# Patient Record
Sex: Female | Born: 1960 | Race: Black or African American | Hispanic: No | Marital: Married | Smoking: Never smoker
Health system: Southern US, Community
[De-identification: ages and names within clinical notes are randomized; demographics above are authoritative.]

## PROBLEM LIST (undated history)

## (undated) DIAGNOSIS — E119 Type 2 diabetes mellitus without complications: Secondary | ICD-10-CM

## (undated) DIAGNOSIS — I10 Essential (primary) hypertension: Secondary | ICD-10-CM

## (undated) DIAGNOSIS — M1990 Unspecified osteoarthritis, unspecified site: Secondary | ICD-10-CM

## (undated) HISTORY — DX: Type 2 diabetes mellitus without complications: E11.9

## (undated) HISTORY — DX: Essential (primary) hypertension: I10

## (undated) HISTORY — DX: Unspecified osteoarthritis, unspecified site: M19.90

## (undated) HISTORY — PX: ABDOMINAL HYSTERECTOMY: SHX81

---

## 2006-10-08 ENCOUNTER — Ambulatory Visit: Payer: Self-pay | Admitting: Internal Medicine

## 2006-10-16 ENCOUNTER — Ambulatory Visit: Payer: Self-pay | Admitting: Internal Medicine

## 2009-11-20 ENCOUNTER — Ambulatory Visit: Payer: Self-pay | Admitting: Internal Medicine

## 2010-11-06 ENCOUNTER — Ambulatory Visit: Payer: Self-pay | Admitting: Internal Medicine

## 2014-10-08 ENCOUNTER — Other Ambulatory Visit: Payer: Self-pay | Admitting: Physician Assistant

## 2014-10-08 DIAGNOSIS — Z1231 Encounter for screening mammogram for malignant neoplasm of breast: Secondary | ICD-10-CM

## 2014-10-12 ENCOUNTER — Ambulatory Visit
Admission: RE | Admit: 2014-10-12 | Discharge: 2014-10-12 | Disposition: A | Discharge status: Home / Self Care | Payer: BC Managed Care – PPO | Source: Ambulatory Visit | Attending: Physician Assistant | Admitting: Physician Assistant

## 2014-10-12 DIAGNOSIS — Z1231 Encounter for screening mammogram for malignant neoplasm of breast: Secondary | ICD-10-CM | POA: Diagnosis present

## 2015-10-13 ENCOUNTER — Other Ambulatory Visit: Payer: Self-pay | Admitting: Physician Assistant

## 2015-10-13 DIAGNOSIS — Z1231 Encounter for screening mammogram for malignant neoplasm of breast: Secondary | ICD-10-CM

## 2015-11-04 ENCOUNTER — Ambulatory Visit: Payer: BC Managed Care – PPO

## 2015-11-10 ENCOUNTER — Ambulatory Visit: Payer: BC Managed Care – PPO | Attending: Physician Assistant

## 2015-11-15 ENCOUNTER — Encounter: Payer: BC Managed Care – PPO | Attending: Physician Assistant | Admitting: *Deleted

## 2015-11-15 ENCOUNTER — Encounter: Payer: Self-pay | Admitting: *Deleted

## 2015-11-15 VITALS — BP 160/100 | Ht 67.0 in | Wt 237.6 lb

## 2015-11-15 DIAGNOSIS — E119 Type 2 diabetes mellitus without complications: Secondary | ICD-10-CM | POA: Insufficient documentation

## 2015-11-15 DIAGNOSIS — E1165 Type 2 diabetes mellitus with hyperglycemia: Secondary | ICD-10-CM

## 2015-11-15 NOTE — Patient Instructions (Signed)
Check blood sugars 2 x day before breakfast and 2 hrs after supper every day  Exercise: Begin walking for    15  minutes   3  days a week and gradually increase to 150 minutes/week  Eat 3 meals day,  2-3  snacks a day Space meals 4-6 hours apart Avoid sugar sweetened drinks (tea)  Bring blood sugar records to the next class  Return for classes on:

## 2015-11-15 NOTE — Progress Notes (Signed)
Diabetes Self-Management Education  Visit Type: First/Initial  Appt. Start Time: 1040 Appt. End Time: 1140  11/15/2015  [name], identified by name and date of birth, is a 55 y.o. female with a diagnosis of Diabetes: Type 2.   ASSESSMENT  Blood pressure (!) 160/100, Pt reports checking her BP at home and readings are 120-130's/70-80's height  (1.702 m), weight 237 lb 9.6 oz (107.8 kg). Body mass index is 37.21 kg/m.      Diabetes Self-Management Education - 11/15/15 1304      Visit Information   Visit Type First/Initial     Initial Visit   Diabetes Type Type 2   Are you currently following a meal plan? Yes   What type of meal plan do you follow? "making healthier choices"   Are you taking your medications as prescribed? Yes   Date Diagnosed 2002     Health Coping   How would you rate your overall health? Good     Psychosocial Assessment   Patient Belief/Attitude about Diabetes Motivated to manage diabetes  Pt also reports "stress" from daily life.    Self-care barriers None   Self-management support Doctor's office   Patient Concerns Nutrition/Meal planning;Weight Control;Medication;Glycemic Control;Monitoring;Healthy Lifestyle   Special Needs None   Preferred Learning Style Visual   Learning Readiness Change in progress   How often do you need to have someone help you when you read instructions, pamphlets, or other written materials from your doctor or pharmacy? 1 - Never   What is the last grade level you completed in school? 18 years     Pre-Education Assessment   Patient understands the diabetes disease and treatment process. Needs Instruction   Patient understands incorporating nutritional management into lifestyle. Needs Instruction   Patient undertands incorporating physical activity into lifestyle. Needs Instruction   Patient understands using medications safely. Needs Review   Patient understands monitoring blood glucose, interpreting and  using results Needs Review   Patient understands prevention, detection, and treatment of acute complications. Needs Instruction   Patient understands prevention, detection, and treatment of chronic complications. Needs Review   Patient understands how to develop strategies to address psychosocial issues. Needs Instruction   Patient understands how to develop strategies to promote health/change behavior. Needs Instruction     Complications   Last HgB A1C per patient/outside source 9.7 %  10/05/15   How often do you check your blood sugar? 3-4 times / week  Pt checks mostly fasting and occasionally in evenings.    Fasting Blood glucose range (mg/dL) >161  FBG 096 mg/dL today   Have you had a dilated eye exam in the past 12 months? Yes   Have you had a dental exam in the past 12 months? Yes   Are you checking your feet? Yes   How many days per week are you checking your feet? 7     Dietary Intake   Breakfast granola bar or [place] yogurt with fruit or boiled egg or cottage cheese with fruit or cereal   Snack (morning) apple and almonds   Lunch salads, Healthy Choice frozen meals   Snack (afternoon) apple and almonds   Dinner meat, starch and vegetables - no bread   Snack (evening) cheese and crackers   Beverage(s) water, occasional diet soda, 1/2 and 1/2 tea     Exercise   Exercise Type ADL's     Patient Education   Previous Diabetes Education No   Disease state  Definition of diabetes,  type 1 and 2, and the diagnosis of diabetes   Nutrition management  Role of diet in the treatment of diabetes and the relationship between the three main macronutrients and blood glucose level;Food label reading, portion sizes and measuring food.   Physical activity and exercise  Role of exercise on diabetes management, blood pressure control and cardiac health.   Medications Reviewed patients medication for diabetes, action, purpose, timing of dose and side effects.   Monitoring Identified appropriate  SMBG and/or A1C goals.   Acute complications Taught treatment of hypoglycemia - the 15 rule. Pt reports hypoglycemia episode in March.    Chronic complications Relationship between chronic complications and blood glucose control   Psychosocial adjustment Identified and addressed patients feelings and concerns about diabetes: Role of stress on diabetes     Individualized Goals (developed by patient)   Reducing Risk Improve blood sugars Decrease medications Prevent diabetes complications Lose weight Lead a healthier lifestyle     Outcomes   Expected Outcomes Demonstrated interest in learning. Expect positive outcomes   Future DMSE 4-6 wks      Individualized Plan for Diabetes Self-Management Training:   Learning Objective:  Patient will have a greater understanding of diabetes self-management. Patient education plan is to attend individual and/or group sessions per assessed needs and concerns.   Plan:   Patient Instructions  Check blood sugars 2 x day before breakfast and 2 hrs after supper every day Exercise: Begin walking for    15  minutes   3  days a week and gradually increase to 150 minutes/week Eat 3 meals day,  2-3  snacks a day Space meals 4-6 hours apart Avoid sugar sweetened drinks (tea) Bring blood sugar records to the next class   Expected Outcomes:  Demonstrated interest in learning. Expect positive outcomes  Education material provided:  General Meal Planning Guidelines Simple Meal Plan Glucose tablets Symptoms, causes and treatments of Hypoglycemia  If problems or questions, patient to contact team via:  Sharion Settler, RN, CCM, CDE [phone]  Future DSME appointment: 4-6 wks  December 13, 2015 for Diabetes Class 1

## 2015-12-13 ENCOUNTER — Ambulatory Visit: Payer: BC Managed Care – PPO

## 2015-12-15 ENCOUNTER — Telehealth: Payer: Self-pay | Admitting: Dietician

## 2015-12-15 NOTE — Telephone Encounter (Signed)
Called patient regarding class which she missed on 12/13/15. Unable to leave a message.

## 2015-12-22 ENCOUNTER — Ambulatory Visit
Admission: RE | Admit: 2015-12-22 | Discharge: 2015-12-22 | Disposition: A | Discharge status: Home / Self Care | Payer: BC Managed Care – PPO | Source: Ambulatory Visit | Attending: Physician Assistant | Admitting: Physician Assistant

## 2015-12-22 ENCOUNTER — Other Ambulatory Visit: Payer: Self-pay | Admitting: Physician Assistant

## 2015-12-22 DIAGNOSIS — Z1231 Encounter for screening mammogram for malignant neoplasm of breast: Secondary | ICD-10-CM

## 2015-12-31 ENCOUNTER — Ambulatory Visit
Admission: RE | Admit: 2015-12-31 | Payer: BC Managed Care – PPO | Source: Ambulatory Visit | Admitting: Unknown Physician Specialty

## 2015-12-31 ENCOUNTER — Encounter: Admission: RE | Payer: Self-pay | Source: Ambulatory Visit

## 2015-12-31 SURGERY — COLONOSCOPY WITH PROPOFOL
Anesthesia: General

## 2016-01-10 ENCOUNTER — Ambulatory Visit: Payer: BC Managed Care – PPO

## 2016-01-13 ENCOUNTER — Telehealth: Payer: Self-pay | Admitting: Dietician

## 2016-01-13 NOTE — Telephone Encounter (Signed)
Called patient after she missed class 1 on 01/10/16. Unable to leave message, as her mailbox is full.

## 2016-01-17 ENCOUNTER — Ambulatory Visit: Payer: BC Managed Care – PPO

## 2016-01-20 ENCOUNTER — Encounter: Payer: Self-pay | Admitting: *Deleted

## 2016-01-24 ENCOUNTER — Ambulatory Visit: Payer: BC Managed Care – PPO

## 2016-05-05 ENCOUNTER — Ambulatory Visit
Admission: RE | Admit: 2016-05-05 | Payer: BC Managed Care – PPO | Source: Ambulatory Visit | Admitting: Unknown Physician Specialty

## 2016-05-05 ENCOUNTER — Encounter: Payer: Self-pay | Admitting: Anesthesiology

## 2016-05-05 SURGERY — COLONOSCOPY WITH PROPOFOL
Anesthesia: General

## 2016-11-09 ENCOUNTER — Other Ambulatory Visit: Payer: Self-pay | Admitting: Physician Assistant

## 2016-11-09 DIAGNOSIS — Z1231 Encounter for screening mammogram for malignant neoplasm of breast: Secondary | ICD-10-CM

## 2017-01-26 ENCOUNTER — Encounter
Admission: RE | Disposition: A | Discharge status: Home / Self Care | Payer: Self-pay | Source: Ambulatory Visit | Attending: Unknown Physician Specialty

## 2017-01-26 ENCOUNTER — Ambulatory Visit: Payer: BC Managed Care – PPO | Admitting: Anesthesiology

## 2017-01-26 ENCOUNTER — Ambulatory Visit
Admission: RE | Admit: 2017-01-26 | Discharge: 2017-01-26 | Disposition: A | Discharge status: Home / Self Care | Payer: BC Managed Care – PPO | Source: Ambulatory Visit | Attending: Unknown Physician Specialty | Admitting: Unknown Physician Specialty

## 2017-01-26 ENCOUNTER — Encounter: Payer: Self-pay | Admitting: *Deleted

## 2017-01-26 DIAGNOSIS — Z79899 Other long term (current) drug therapy: Secondary | ICD-10-CM | POA: Insufficient documentation

## 2017-01-26 DIAGNOSIS — Z803 Family history of malignant neoplasm of breast: Secondary | ICD-10-CM | POA: Diagnosis not present

## 2017-01-26 DIAGNOSIS — Z880 Allergy status to penicillin: Secondary | ICD-10-CM | POA: Insufficient documentation

## 2017-01-26 DIAGNOSIS — K640 First degree hemorrhoids: Secondary | ICD-10-CM | POA: Insufficient documentation

## 2017-01-26 DIAGNOSIS — Z7984 Long term (current) use of oral hypoglycemic drugs: Secondary | ICD-10-CM | POA: Insufficient documentation

## 2017-01-26 DIAGNOSIS — K635 Polyp of colon: Secondary | ICD-10-CM | POA: Insufficient documentation

## 2017-01-26 DIAGNOSIS — Z1211 Encounter for screening for malignant neoplasm of colon: Secondary | ICD-10-CM | POA: Diagnosis not present

## 2017-01-26 DIAGNOSIS — Z833 Family history of diabetes mellitus: Secondary | ICD-10-CM | POA: Diagnosis not present

## 2017-01-26 DIAGNOSIS — K5730 Diverticulosis of large intestine without perforation or abscess without bleeding: Secondary | ICD-10-CM | POA: Insufficient documentation

## 2017-01-26 DIAGNOSIS — M1990 Unspecified osteoarthritis, unspecified site: Secondary | ICD-10-CM | POA: Insufficient documentation

## 2017-01-26 DIAGNOSIS — I10 Essential (primary) hypertension: Secondary | ICD-10-CM | POA: Insufficient documentation

## 2017-01-26 DIAGNOSIS — E119 Type 2 diabetes mellitus without complications: Secondary | ICD-10-CM | POA: Insufficient documentation

## 2017-01-26 HISTORY — PX: COLONOSCOPY WITH PROPOFOL: SHX5780

## 2017-01-26 LAB — GLUCOSE, CAPILLARY
GLUCOSE-CAPILLARY: 327 mg/dL — AB (ref 65–99)
GLUCOSE-CAPILLARY: 261 mg/dL — AB (ref 65–99)

## 2017-01-26 SURGERY — COLONOSCOPY WITH PROPOFOL
Anesthesia: General

## 2017-01-26 MED ORDER — SODIUM CHLORIDE 0.9 % IV SOLN: INTRAVENOUS | Status: DC

## 2017-01-26 MED ORDER — PROPOFOL 10 MG/ML IV BOLUS
INTRAVENOUS | Status: DC | PRN
  Administered 2017-01-26: 50 mg via INTRAVENOUS
  Administered 2017-01-26: 100 mg via INTRAVENOUS

## 2017-01-26 MED ORDER — SODIUM CHLORIDE 0.9 % IV SOLN
INTRAVENOUS | Status: DC
  Administered 2017-01-26: 09:00:00 via INTRAVENOUS

## 2017-01-26 MED ORDER — LIDOCAINE HCL (PF) 2 % IJ SOLN
INTRAMUSCULAR | Status: AC
  Filled 2017-01-26: qty 10

## 2017-01-26 MED ORDER — LIDOCAINE 2% (20 MG/ML) 5 ML SYRINGE
INTRAMUSCULAR | Status: DC | PRN
  Administered 2017-01-26: 30 mg via INTRAVENOUS

## 2017-01-26 MED ORDER — PROPOFOL 500 MG/50ML IV EMUL
INTRAVENOUS | Status: AC
  Filled 2017-01-26: qty 50

## 2017-01-26 MED ORDER — MIDAZOLAM HCL 5 MG/5ML IJ SOLN
INTRAMUSCULAR | Status: DC | PRN
  Administered 2017-01-26: 2 mg via INTRAVENOUS

## 2017-01-26 MED ORDER — FENTANYL CITRATE (PF) 100 MCG/2ML IJ SOLN
INTRAMUSCULAR | Status: DC | PRN
  Administered 2017-01-26 (×2): 50 ug via INTRAVENOUS

## 2017-01-26 MED ORDER — FENTANYL CITRATE (PF) 100 MCG/2ML IJ SOLN
INTRAMUSCULAR | Status: AC
  Filled 2017-01-26: qty 2

## 2017-01-26 MED ORDER — INSULIN ASPART 100 UNIT/ML ~~LOC~~ SOLN
8.0000 [IU] | Freq: Once | SUBCUTANEOUS | Status: AC
Start: 2017-01-26 — End: 2017-01-26
  Administered 2017-01-26: 8 [IU] via SUBCUTANEOUS

## 2017-01-26 MED ORDER — PROPOFOL 500 MG/50ML IV EMUL
INTRAVENOUS | Status: DC | PRN
  Administered 2017-01-26: 160 ug/kg/min via INTRAVENOUS
  Administered 2017-01-26: 140 ug/kg/min via INTRAVENOUS

## 2017-01-26 MED ORDER — PROPOFOL 10 MG/ML IV BOLUS
INTRAVENOUS | Status: AC
  Filled 2017-01-26: qty 20

## 2017-01-26 MED ORDER — MIDAZOLAM HCL 2 MG/2ML IJ SOLN
INTRAMUSCULAR | Status: AC
  Filled 2017-01-26: qty 2

## 2017-01-26 NOTE — Op Note (Signed)
[facility] Patient Name: Denise Villa Procedure Date: 01/26/2017 9:25 AM MRN: [id number] Account #: [id number] Date of Birth: Apr 21, 1960 Admit Type: Outpatient Age: 56 Room: [facility] ENDO ROOM 3 Gender: Female Note Status: Finalized Procedure:            Colonoscopy Indications:          Screening for colorectal malignant neoplasm Providers:            Scot Jun, MD Referring MD:         Marilynne Halsted, MD (Referring MD) Medicines:            Propofol per Anesthesia Complications:        No immediate complications. Procedure:            Pre-Anesthesia Assessment:                       - After reviewing the risks and benefits, the patient                        was deemed in satisfactory condition to undergo the                        procedure.                       After obtaining informed consent, the colonoscope was                        passed under direct vision. Throughout the procedure,                        the patient's blood pressure, pulse, and oxygen                        saturations were monitored continuously. The                        Colonoscope was introduced through the anus and                        advanced to the the cecum, identified by appendiceal                        orifice and ileocecal valve. The colonoscopy was                        performed without difficulty. The patient tolerated the                        procedure well. The quality of the bowel preparation                        was good. Findings:      Two sessile polyps were found in the recto-sigmoid colon. The polyps       were diminutive in size. These polyps were removed with a jumbo cold       forceps. Resection and retrieval were complete.      A single small-mouthed diverticulum was found in the hepatic flexure.      Internal hemorrhoids were found during endoscopy. The hemorrhoids were  small and Grade I (internal  hemorrhoids that do not prolapse).      The exam was otherwise without abnormality. Impression:           - Two diminutive polyps at the recto-sigmoid colon,                        removed with a jumbo cold forceps. Resected and                        retrieved.                       - Diverticulosis at the hepatic flexure.                       - Internal hemorrhoids.                       - The examination was otherwise normal. Recommendation:       - Await pathology results. Scot Jun, MD 01/26/2017 9:57:10 AM This report has been signed electronically. Number of Addenda: 0 Note Initiated On: 01/26/2017 9:25 AM Scope Withdrawal Time: 0 hours 12 minutes 54 seconds  Total Procedure Duration: 0 hours 23 minutes 19 seconds       [facility]

## 2017-01-26 NOTE — Anesthesia Postprocedure Evaluation (Signed)
Anesthesia Post Note  Patient: Denise Villa  Procedure(s) Performed: COLONOSCOPY WITH PROPOFOL (N/A )  Patient location during evaluation: Endoscopy Anesthesia Type: General Level of consciousness: awake and alert Pain management: pain level controlled Vital Signs Assessment: post-procedure vital signs reviewed and stable Respiratory status: spontaneous breathing and respiratory function stable Cardiovascular status: stable Anesthetic complications: no     Last Vitals:  Vitals:   01/26/17 1000 01/26/17 1002  BP: 125/78 125/78  Pulse: 74 74  Resp: 17 12  Temp: 36.5 C 36.6 C  SpO2: 99% 99%    Last Pain:  Vitals:   01/26/17 0858  TempSrc: Tympanic                 KEPHART,WILLIAM K

## 2017-01-26 NOTE — Anesthesia Preprocedure Evaluation (Signed)
Anesthesia Evaluation  Patient identified by MRN, date of birth, ID band Patient awake    Reviewed: Allergy & Precautions, NPO status , Patient's Chart, lab work & pertinent test results  History of Anesthesia Complications Negative for: history of anesthetic complications  Airway Mallampati: II       Dental   Pulmonary neg sleep apnea, neg COPD,           Cardiovascular hypertension, Pt. on medications and Pt. on home beta blockers (-) Past MI and (-) CHF (-) dysrhythmias (-) Valvular Problems/Murmurs     Neuro/Psych neg Seizures    GI/Hepatic Neg liver ROS, neg GERD  ,  Endo/Other  diabetes, Type 2, Oral Hypoglycemic Agents  Renal/GU negative Renal ROS     Musculoskeletal   Abdominal   Peds  Hematology   Anesthesia Other Findings   Reproductive/Obstetrics                             Anesthesia Physical Anesthesia Plan  ASA: III  Anesthesia Plan: General   Post-op Pain Management:    Induction: Intravenous  PONV Risk Score and Plan: Propofol infusion  Airway Management Planned:   Additional Equipment:   Intra-op Plan:   Post-operative Plan:   Informed Consent: I have reviewed the patients History and Physical, chart, labs and discussed the procedure including the risks, benefits and alternatives for the proposed anesthesia with the patient or authorized representative who has indicated his/her understanding and acceptance.     Plan Discussed with:   Anesthesia Plan Comments:         Anesthesia Quick Evaluation

## 2017-01-26 NOTE — Anesthesia Post-op Follow-up Note (Signed)
Anesthesia QCDR form completed.        

## 2017-01-26 NOTE — Transfer of Care (Signed)
Immediate Anesthesia Transfer of Care Note  Patient: Denise Villa  Procedure(s) Performed: COLONOSCOPY WITH PROPOFOL (N/A )  Patient Location: PACU and Endoscopy Unit  Anesthesia Type:General  Level of Consciousness: awake and patient cooperative  Airway & Oxygen Therapy: Patient Spontanous Breathing and Patient connected to nasal cannula oxygen  Post-op Assessment: Report given to RN and Post -op Vital signs reviewed and stable  Post vital signs: Reviewed and stable  Last Vitals:  Vitals:   01/26/17 0858 01/26/17 1000  BP: (!) 191/97 125/78  Pulse: 80 74  Resp: (!) 22 17  Temp: 36.4 C 36.5 C  SpO2: 100% 99%    Last Pain:  Vitals:   01/26/17 0858  TempSrc: Tympanic         Complications: No apparent anesthesia complications

## 2017-01-26 NOTE — H&P (Signed)
   Primary Care Physician:  Patrice Paradise, MD Primary Gastroenterologist:  Dr. Mechele Collin  Pre-Procedure History & Physical: HPI:  Denise Villa is a 56 y.o. female is here for an colonoscopy.   Past Medical History:  Diagnosis Date  . Arthritis   . Diabetes mellitus without complication (HCC)   . Hypertension     Past Surgical History:  Procedure Laterality Date  . ABDOMINAL HYSTERECTOMY      Prior to Admission medications   Medication Sig Start Date End Date Taking? Authorizing Provider  atenolol (TENORMIN) 100 MG tablet Take 100 mg by mouth daily. 10/13/15  Yes [provider]  BLACK CURRANT SEED OIL PO Take 0.5 packets by mouth 2 (two) times daily.   Yes [provider]  Cinnamon 500 MG capsule Take 500 mg by mouth 3 (three) times daily.   Yes [provider]  glipiZIDE (GLUCOTROL XL) 10 MG 24 hr tablet Take 10 mg by mouth 2 (two) times daily. 06/16/15  Yes [provider]  hydrochlorothiazide (HYDRODIURIL) 25 MG tablet Take 25 mg by mouth daily. 08/16/15  Yes [provider]  lisinopril (PRINIVIL,ZESTRIL) 40 MG tablet Take 40 mg by mouth daily. 09/14/15  Yes [provider]  metFORMIN (GLUCOPHAGE-XR) 500 MG 24 hr tablet Take 1,000 mg by mouth 2 (two) times daily. 11/10/15  Yes [provider]  Multiple Vitamin (MULTI-VITAMINS) TABS Take 1 tablet by mouth daily.   Yes [provider]  polyethylene glycol powder (GLYCOLAX/MIRALAX) powder Take as directed for colonoscopy prep. 11/01/15   [provider]    Allergies as of 11/01/2016 - Review Complete 11/15/2015  Allergen Reaction Noted  . Penicillin g Rash 02/27/2014  . Procaine Rash 10/08/2014    Family History  Problem Relation Age of Onset  . Diabetes Mother   . Breast cancer Neg Hx     Social History   Social History  . Marital status: Married    Spouse name: N/A  . Number of children: N/A  . Years of education: N/A    Occupational History  . Not on file.   Social History Main Topics  . Smoking status: Never Smoker  . Smokeless tobacco: Never Used  . Alcohol use No  . Drug use: No  . Sexual activity: Not on file   Other Topics Concern  . Not on file   Social History Narrative  . No narrative on file    Review of Systems: See HPI, otherwise negative ROS  Physical Exam: BP (!) 191/97   Pulse 80   Temp 97.6 F (36.4 C) (Tympanic)   Resp (!) 22   Ht  (1.702 m)   Wt 105.2 kg (232 lb)   SpO2 100%   BMI 36.34 kg/m  General:   Alert,  pleasant and cooperative in NAD Head:  Normocephalic and atraumatic. Neck:  Supple; no masses or thyromegaly. Lungs:  Clear throughout to auscultation.    Heart:  Regular rate and rhythm. Abdomen:  Soft, nontender and nondistended. Normal bowel sounds, without guarding, and without rebound.   Neurologic:  Alert and  oriented x4;  grossly normal neurologically.  Impression/Plan: Denise Villa is here for an colonoscopy to be performed for screening for colon cancer  Risks, benefits, limitations, and alternatives regarding  colonoscopy have been reviewed with the patient.  Questions have been answered.  All parties agreeable.   Lynnae Prude, MD  01/26/2017, 9:15 AM

## 2017-01-29 ENCOUNTER — Encounter: Payer: Self-pay | Admitting: Unknown Physician Specialty

## 2017-01-29 LAB — SURGICAL PATHOLOGY

## 2017-02-01 ENCOUNTER — Ambulatory Visit
Admission: RE | Admit: 2017-02-01 | Discharge: 2017-02-01 | Disposition: A | Discharge status: Home / Self Care | Payer: BC Managed Care – PPO | Source: Ambulatory Visit | Attending: Physician Assistant | Admitting: Physician Assistant

## 2017-02-01 DIAGNOSIS — Z1231 Encounter for screening mammogram for malignant neoplasm of breast: Secondary | ICD-10-CM

## 2017-12-31 ENCOUNTER — Other Ambulatory Visit: Payer: Self-pay | Admitting: Physician Assistant

## 2017-12-31 DIAGNOSIS — Z1231 Encounter for screening mammogram for malignant neoplasm of breast: Secondary | ICD-10-CM

## 2018-02-04 ENCOUNTER — Ambulatory Visit
Admission: RE | Admit: 2018-02-04 | Discharge: 2018-02-04 | Disposition: A | Discharge status: Home / Self Care | Payer: BC Managed Care – PPO | Source: Ambulatory Visit | Attending: Physician Assistant | Admitting: Physician Assistant

## 2018-02-04 DIAGNOSIS — Z1231 Encounter for screening mammogram for malignant neoplasm of breast: Secondary | ICD-10-CM | POA: Diagnosis present

## 2019-01-31 ENCOUNTER — Other Ambulatory Visit: Payer: Self-pay | Admitting: Family Medicine

## 2019-01-31 DIAGNOSIS — Z1231 Encounter for screening mammogram for malignant neoplasm of breast: Secondary | ICD-10-CM

## 2019-04-24 ENCOUNTER — Ambulatory Visit
Admission: RE | Admit: 2019-04-24 | Discharge: 2019-04-24 | Disposition: A | Discharge status: Home / Self Care | Payer: BC Managed Care – PPO | Source: Ambulatory Visit | Attending: Family Medicine | Admitting: Family Medicine

## 2019-04-24 DIAGNOSIS — Z1231 Encounter for screening mammogram for malignant neoplasm of breast: Secondary | ICD-10-CM | POA: Diagnosis present

## 2020-04-20 ENCOUNTER — Other Ambulatory Visit: Payer: Self-pay | Admitting: Family Medicine

## 2020-04-20 DIAGNOSIS — Z1231 Encounter for screening mammogram for malignant neoplasm of breast: Secondary | ICD-10-CM

## 2020-05-21 ENCOUNTER — Emergency Department: Payer: BC Managed Care – PPO

## 2020-05-21 ENCOUNTER — Other Ambulatory Visit: Payer: Self-pay

## 2020-05-21 ENCOUNTER — Emergency Department
Admission: EM | Admit: 2020-05-21 | Discharge: 2020-05-21 | Disposition: A | Discharge status: Home / Self Care | Payer: BC Managed Care – PPO | Attending: Emergency Medicine | Admitting: Emergency Medicine

## 2020-05-21 DIAGNOSIS — U071 COVID-19: Secondary | ICD-10-CM | POA: Diagnosis not present

## 2020-05-21 DIAGNOSIS — E119 Type 2 diabetes mellitus without complications: Secondary | ICD-10-CM | POA: Diagnosis not present

## 2020-05-21 DIAGNOSIS — R55 Syncope and collapse: Secondary | ICD-10-CM | POA: Diagnosis not present

## 2020-05-21 DIAGNOSIS — R1110 Vomiting, unspecified: Secondary | ICD-10-CM | POA: Insufficient documentation

## 2020-05-21 DIAGNOSIS — R531 Weakness: Secondary | ICD-10-CM | POA: Diagnosis not present

## 2020-05-21 DIAGNOSIS — R197 Diarrhea, unspecified: Secondary | ICD-10-CM | POA: Diagnosis not present

## 2020-05-21 DIAGNOSIS — I10 Essential (primary) hypertension: Secondary | ICD-10-CM | POA: Diagnosis not present

## 2020-05-21 DIAGNOSIS — Z7984 Long term (current) use of oral hypoglycemic drugs: Secondary | ICD-10-CM | POA: Diagnosis not present

## 2020-05-21 DIAGNOSIS — Z79899 Other long term (current) drug therapy: Secondary | ICD-10-CM | POA: Insufficient documentation

## 2020-05-21 DIAGNOSIS — R42 Dizziness and giddiness: Secondary | ICD-10-CM | POA: Diagnosis present

## 2020-05-21 DIAGNOSIS — M546 Pain in thoracic spine: Secondary | ICD-10-CM | POA: Diagnosis not present

## 2020-05-21 LAB — CBC WITH DIFFERENTIAL/PLATELET
Abs Immature Granulocytes: 0.05 10*3/uL (ref 0.00–0.07)
Basophils Absolute: 0 10*3/uL (ref 0.0–0.1)
Basophils Relative: 0 %
Eosinophils Absolute: 0 10*3/uL (ref 0.0–0.5)
Eosinophils Relative: 0 %
HCT: 41.6 % (ref 36.0–46.0)
Hemoglobin: 14 g/dL (ref 12.0–15.0)
Immature Granulocytes: 1 %
Lymphocytes Relative: 25 %
Lymphs Abs: 2 10*3/uL (ref 0.7–4.0)
MCH: 30.3 pg (ref 26.0–34.0)
MCHC: 33.7 g/dL (ref 30.0–36.0)
MCV: 90 fL (ref 80.0–100.0)
Monocytes Absolute: 0.4 10*3/uL (ref 0.1–1.0)
Monocytes Relative: 6 %
Neutro Abs: 5.5 10*3/uL (ref 1.7–7.7)
Neutrophils Relative %: 68 %
Platelets: 475 10*3/uL — ABNORMAL HIGH (ref 150–400)
RBC: 4.62 MIL/uL (ref 3.87–5.11)
RDW: 12.1 % (ref 11.5–15.5)
WBC: 8.1 10*3/uL (ref 4.0–10.5)
nRBC: 0 % (ref 0.0–0.2)

## 2020-05-21 LAB — COMPREHENSIVE METABOLIC PANEL
ALT: 14 U/L (ref 0–44)
AST: 20 U/L (ref 15–41)
Albumin: 3.7 g/dL (ref 3.5–5.0)
Alkaline Phosphatase: 59 U/L (ref 38–126)
Anion gap: 10 (ref 5–15)
BUN: 29 mg/dL — ABNORMAL HIGH (ref 8–23)
CO2: 28 mmol/L (ref 22–32)
Calcium: 9.5 mg/dL (ref 8.9–10.3)
Chloride: 98 mmol/L (ref 98–111)
Creatinine, Ser: 1.11 mg/dL — ABNORMAL HIGH (ref 0.44–1.00)
GFR, Estimated: 56 mL/min — ABNORMAL LOW (ref >60)
Glucose, Bld: 308 mg/dL — ABNORMAL HIGH (ref 70–99)
Potassium: 3.5 mmol/L (ref 3.5–5.1)
Sodium: 136 mmol/L (ref 135–145)
Total Bilirubin: 1.1 mg/dL (ref 0.3–1.2)
Total Protein: 8.1 g/dL (ref 6.5–8.1)

## 2020-05-21 LAB — TROPONIN I (HIGH SENSITIVITY)
Troponin I (High Sensitivity): 5 ng/L (ref <18)
Troponin I (High Sensitivity): 4 ng/L (ref <18)

## 2020-05-21 LAB — CBG MONITORING, ED: Glucose-Capillary: 227 mg/dL — ABNORMAL HIGH (ref 70–99)

## 2020-05-21 MED ORDER — ACETAMINOPHEN 500 MG PO TABS
1000.0000 mg | ORAL_TABLET | Freq: Once | ORAL | Status: AC
  Administered 2020-05-21: 1000 mg via ORAL

## 2020-05-21 MED ORDER — ONDANSETRON HCL 4 MG/2ML IJ SOLN
4.0000 mg | Freq: Once | INTRAMUSCULAR | Status: AC
  Administered 2020-05-21: 4 mg via INTRAVENOUS
  Filled 2020-05-21: qty 2

## 2020-05-21 MED ORDER — LACTATED RINGERS IV BOLUS
1000.0000 mL | Freq: Once | INTRAVENOUS | Status: AC
  Administered 2020-05-21: 1000 mL via INTRAVENOUS

## 2020-05-21 NOTE — ED Triage Notes (Signed)
Pt from home via [facility]. Pt states at 0300 she was feeling very weak and sick and felt like her blood sugar was low. No CBG obtained prior to pt drinking orange juice. CBG 260 with EMS. Pt clammy to touch. Pt A&Ox4. Pt reports pain in the upper back that radiates out to the left.

## 2020-05-21 NOTE — ED Notes (Signed)
Pt given pillow, warm blankets, and tv remote per request.

## 2020-05-21 NOTE — ED Provider Notes (Addendum)
[facility] Emergency Department Provider Note  ____________________________________________  Time seen: Approximately 5:37 AM  I have reviewed the triage vital signs and the nursing notes.   HISTORY  Chief Complaint Weakness   HPI Denise Villa is a 60 y.o. female with a history of diabetes, hypertension, and Covid +9 days ago who presents for evaluation of generalized weakness and lightheadedness.  Patient reports that she was doing well when she went to bed last night.  She woke up at 3 AM feeling very weak and sick.  She had 2 episodes of diarrhea.  She felt very clammy and felt like she was going to pass out.  She thought her sugars were low so she drank some orange juice.  She did not check her sugar.  She continued to feel like she was going to pass out therefore EMS was called.  When they arrived patient was clammy and diaphoretic, hypotensive with systolics in the 80s.  Her CBG per EMS was 260.  Patient also reports a dull achy pain on the L upper back for the last few days. The pain is not pleuritic in nature. Patient denies chest pain, fever, shortness of breath, or cough.  No personal or family history of PE or DVT, no recent travel immobilization, no leg pain or swelling, no hemoptysis, no exogenous hormones she does report one episode of nonbloody nonbilious emesis when she was feeling really bad.  No abdominal pain, no body aches, no sore throat.   Past Medical History:  Diagnosis Date  . Arthritis   . Diabetes mellitus without complication (HCC)   . Hypertension     There are no problems to display for this patient.   Past Surgical History:  Procedure Laterality Date  . ABDOMINAL HYSTERECTOMY    . COLONOSCOPY WITH PROPOFOL N/A 01/26/2017   Procedure: COLONOSCOPY WITH PROPOFOL;  Surgeon: Scot Jun, MD;  Location: [facility] ENDOSCOPY;  Service: Endoscopy;  Laterality: N/A;    Prior to Admission medications   Medication Sig Start Date  End Date Taking? Authorizing Provider  atenolol (TENORMIN) 100 MG tablet Take 100 mg by mouth daily. 10/13/15   [provider]  BLACK CURRANT SEED OIL PO Take 0.5 packets by mouth 2 (two) times daily.    [provider]  Cinnamon 500 MG capsule Take 500 mg by mouth 3 (three) times daily.    [provider]  glipiZIDE (GLUCOTROL XL) 10 MG 24 hr tablet Take 10 mg by mouth 2 (two) times daily. 06/16/15   [provider]  hydrochlorothiazide (HYDRODIURIL) 25 MG tablet Take 25 mg by mouth daily. 08/16/15   [provider]  lisinopril (PRINIVIL,ZESTRIL) 40 MG tablet Take 40 mg by mouth daily. 09/14/15   [provider]  metFORMIN (GLUCOPHAGE-XR) 500 MG 24 hr tablet Take 1,000 mg by mouth 2 (two) times daily. 11/10/15   [provider]  Multiple Vitamin (MULTI-VITAMINS) TABS Take 1 tablet by mouth daily.    [provider]  polyethylene glycol powder (GLYCOLAX/MIRALAX) powder Take as directed for colonoscopy prep. 11/01/15   [provider]    Allergies Penicillin g and Procaine  Family History  Problem Relation Age of Onset  . Diabetes Mother   . Breast cancer Neg Hx     Social History Social History   Tobacco Use  . Smoking status: Never Smoker  . Smokeless tobacco: Never Used  Vaping Use  . Vaping Use: Never used  Substance Use Topics  .  Alcohol use: No  . Drug use: No    Review of Systems  Constitutional: Negative for fever. + Clammy, lightheaded Eyes: Negative for visual changes. ENT: Negative for sore throat. Neck: No neck pain  Cardiovascular: Negative for chest pain. Respiratory: Negative for shortness of breath. Gastrointestinal: Negative for abdominal pain. + vomiting and diarrhea. Genitourinary: Negative for dysuria. Musculoskeletal: + L upper back pain. Skin: Negative for rash. Neurological: Negative for headaches, weakness or numbness. Psych: No SI or  HI  ____________________________________________   PHYSICAL EXAM:  VITAL SIGNS: ED Triage Vitals  Enc Vitals Group     BP 05/21/20 0525 124/76     Pulse Rate 05/21/20 0525 74     Resp 05/21/20 0525 17     Temp 05/21/20 0525 97.6 F (36.4 C)     Temp Source 05/21/20 0525 Oral     SpO2 05/21/20 0525 100 %     Weight 05/21/20 0522 215 lb (97.5 kg)     Height 05/21/20 0522 5\' 7"  (1.702 m)     Head Circumference --      Peak Flow --      Pain Score 05/21/20 0522 10     Pain Loc --      Pain Edu? --      Excl. in GC? --     Constitutional: Alert and oriented. Well appearing and in no apparent distress. HEENT:      Head: Normocephalic and atraumatic.         Eyes: Conjunctivae are normal. Sclera is non-icteric.       Mouth/Throat: Mucous membranes are moist.       Neck: Supple with no signs of meningismus. Cardiovascular: Regular rate and rhythm. No murmurs, gallops, or rubs. 2+ symmetrical distal pulses are present in all extremities. Respiratory: Normal respiratory effort. Lungs are clear to auscultation bilaterally. No wheezes, crackles, or rhonchi.  Gastrointestinal: Soft, non tender, and non distended with positive bowel sounds. No rebound or guarding. Genitourinary: No CVA tenderness. Musculoskeletal: No midline CT no spine tenderness.  Patient is tender to palpation over the left upper trapezius area with no rash, no erythema, no warmth.  No edema, cyanosis, or erythema of extremities. Neurologic: Normal speech and language. Face is symmetric. Moving all extremities. No gross focal neurologic deficits are appreciated. Skin: Skin is warm, dry and intact. No rash noted. Psychiatric: Mood and affect are normal. Speech and behavior are normal.  ____________________________________________   LABS (all labs ordered are listed, but only abnormal results are displayed)  Labs Reviewed  CBC WITH DIFFERENTIAL/PLATELET - Abnormal; Notable for the following components:      Result  Value   Platelets 475 (*)    All other components within normal limits  COMPREHENSIVE METABOLIC PANEL - Abnormal; Notable for the following components:   Glucose, Bld 308 (*)    BUN 29 (*)    Creatinine, Ser 1.11 (*)    GFR, Estimated 56 (*)    All other components within normal limits  CBG MONITORING, ED - Abnormal; Notable for the following components:   Glucose-Capillary 227 (*)    All other components within normal limits  URINALYSIS, COMPLETE (UACMP) WITH MICROSCOPIC  CBG MONITORING, ED  TROPONIN I (HIGH SENSITIVITY)  TROPONIN I (HIGH SENSITIVITY)   ____________________________________________  EKG  ED ECG REPORT I, Nita Sickle, the attending physician, personally viewed and interpreted this ECG.  Sinus rhythm, rate of 75, right bundle branch block, prolonged QTC, no ST elevations or depressions.  No prior  for comparison ____________________________________________  RADIOLOGY  I have personally reviewed the images performed during this visit and I agree with the Radiologist's read.   Interpretation by Radiologist:  DG Chest Portable 1 View  Result Date: 05/21/2020 CLINICAL DATA:  COVID-19 positive, left upper back pain EXAM: PORTABLE CHEST 1 VIEW COMPARISON:  None. FINDINGS: Heterogeneous opacities are present in both lungs with diffuse airways thickening. No pneumothorax. No effusion. The cardiomediastinal contours are unremarkable. No acute osseous or soft tissue abnormality. Telemetry leads overlie the chest. IMPRESSION: Heterogeneous opacities in both lungs with diffuse airways thickening, compatible with multifocal infection in the setting of COVID-19. Electronically Signed   By: Kreg Shropshire M.D.   On: 05/21/2020 06:15      ____________________________________________   PROCEDURES  Procedure(s) performed:yes .1-3 Lead EKG Interpretation Performed by: Nita Sickle, MD Authorized by: Nita Sickle, MD     Interpretation: abnormal     ECG rate  assessment: normal     Rhythm: sinus rhythm     Ectopy: none     Conduction: abnormal     Abnormal conduction comment:  RBBB and prolonged QTc   Critical Care performed:  None ____________________________________________   INITIAL IMPRESSION / ASSESSMENT AND PLAN / ED COURSE  60 y.o. female with a history of diabetes, hypertension, and Covid +9 days ago who presents for evaluation of generalized weakness and lightheadedness, near syncope in the setting of two episodes of diarrhea and one episode of NBNB emesis. Also complaining of 3 days of L upper back pain (dull, soreness).  On arrival patient is well-appearing with normal vital signs, not clammy or diaphoretic, not pale, normal work of breathing and normal sats with clear lungs.  She does have some tenderness to palpation of the left upper trapezius area but no midline CT and L-spine tenderness.  There is no rash or any other abnormalities on visual inspection.  Heart regular rate and rhythm, abdomen is soft and nontender.  EKG showing borderline prolonged QTC and a right bundle branch block.  No prior for comparison.  CBG WNL but patient did have orange juice at home.  Ddx hypoglycemia, dehydration, vasovagal, electrolyte derangements, cardiac dysrhythmia, sepsis, pneumonia.  Also possible PE with a complaint of left upper back pain although Patient denies pleuritic chest pain, shortness of breath, cough.  She has no tachypnea, no tachycardia, no hypoxia.   Patient placed on telemetry for close monitoring of her cardiorespiratory status.  Labs pending.  Will give Zofran and IV fluids.  Patient requesting pain medicine for her upper back which I will give her Tylenol.  We will get a chest x-ray.  Old medical records reviewed confirming positive Covid test on January 26.  She is unvaccinated.  _________________________ 7:10 AM on 05/21/2020 -----------------------------------------  Chest x-ray showing multifocal pneumonia, visualized by me  confirmed by radiology.  No signs of hypoxia or increased work of breathing.  Labs showing blood glucose of 308 with no signs of DKA, stable kidney function.  First high-sensitivity troponin is negative.  Plan to ambulate to make sure patient does not become hypoxic and have a repeat troponin done.  If those are negative plan to discharge home.  Care transferred to Dr. Vicente Males      _____________________________________________ Please note:  Patient was evaluated in Emergency Department today for the symptoms described in the history of present illness. Patient was evaluated in the context of the global COVID-19 pandemic, which necessitated consideration that the patient might be at risk for infection with the  SARS-CoV-2 virus that causes COVID-19. Institutional protocols and algorithms that pertain to the evaluation of patients at risk for COVID-19 are in a state of rapid change based on information released by regulatory bodies including the CDC and federal and state organizations. These policies and algorithms were followed during the patient's care in the ED.  Some ED evaluations and interventions may be delayed as a result of limited staffing during the pandemic.   [place] Controlled Substance Database was reviewed by me. ____________________________________________   FINAL CLINICAL IMPRESSION(S) / ED DIAGNOSES   Final diagnoses:  Near syncope  COVID-19      NEW MEDICATIONS STARTED DURING THIS VISIT:  ED Discharge Orders    None       Note:  This document was prepared using Dragon voice recognition software and may include unintentional dictation errors.    Don Perking, [place], MD 05/21/20 [id number]    Nita Sickle, MD 05/21/20 [phone]

## 2020-05-21 NOTE — ED Notes (Signed)
Pt ambulatory in room with pulse oximetry. Pt remains at 95-98% on room air with no distress. Md Bradler notified.

## 2020-06-22 ENCOUNTER — Ambulatory Visit
Admission: RE | Admit: 2020-06-22 | Discharge: 2020-06-22 | Disposition: A | Discharge status: Home / Self Care | Payer: BC Managed Care – PPO | Source: Ambulatory Visit | Attending: Family Medicine | Admitting: Family Medicine

## 2020-06-22 ENCOUNTER — Other Ambulatory Visit: Payer: Self-pay

## 2020-06-22 DIAGNOSIS — Z1231 Encounter for screening mammogram for malignant neoplasm of breast: Secondary | ICD-10-CM | POA: Insufficient documentation

## 2021-05-16 ENCOUNTER — Other Ambulatory Visit: Payer: Self-pay | Admitting: Family Medicine

## 2021-05-16 DIAGNOSIS — Z1231 Encounter for screening mammogram for malignant neoplasm of breast: Secondary | ICD-10-CM

## 2021-06-23 ENCOUNTER — Ambulatory Visit
Admission: RE | Admit: 2021-06-23 | Discharge: 2021-06-23 | Disposition: A | Discharge status: Home / Self Care | Payer: BC Managed Care – PPO | Source: Ambulatory Visit | Attending: Family Medicine | Admitting: Family Medicine

## 2021-06-23 ENCOUNTER — Other Ambulatory Visit: Payer: Self-pay

## 2021-06-23 DIAGNOSIS — Z1231 Encounter for screening mammogram for malignant neoplasm of breast: Secondary | ICD-10-CM | POA: Insufficient documentation

## 2021-06-28 ENCOUNTER — Ambulatory Visit: Payer: BC Managed Care – PPO | Admitting: Internal Medicine

## 2021-10-03 ENCOUNTER — Encounter: Payer: Self-pay | Admitting: Internal Medicine

## 2021-10-03 ENCOUNTER — Ambulatory Visit (INDEPENDENT_AMBULATORY_CARE_PROVIDER_SITE_OTHER): Payer: BC Managed Care – PPO | Admitting: Internal Medicine

## 2021-10-03 VITALS — BP 140/80 | HR 69 | Ht 67.0 in | Wt 231.0 lb

## 2021-10-03 DIAGNOSIS — E785 Hyperlipidemia, unspecified: Secondary | ICD-10-CM | POA: Diagnosis not present

## 2021-10-03 DIAGNOSIS — E113593 Type 2 diabetes mellitus with proliferative diabetic retinopathy without macular edema, bilateral: Secondary | ICD-10-CM

## 2021-10-03 DIAGNOSIS — E1165 Type 2 diabetes mellitus with hyperglycemia: Secondary | ICD-10-CM

## 2021-10-03 MED ORDER — GLIPIZIDE 5 MG PO TABS: 5.0000 mg | ORAL_TABLET | Freq: Two times a day (BID) | ORAL | 3 refills | Status: DC

## 2021-10-03 MED ORDER — METFORMIN HCL 1000 MG PO TABS: 1000.0000 mg | ORAL_TABLET | Freq: Two times a day (BID) | ORAL | 3 refills | Status: DC

## 2021-10-03 MED ORDER — RYBELSUS 7 MG PO TABS: 7.0000 mg | ORAL_TABLET | Freq: Every day | ORAL | 1 refills | Status: DC

## 2021-10-03 NOTE — Progress Notes (Signed)
Name: Denise Villa  MRN/ DOB: [id number], Jul 26, 1960   Age/ Sex: 61 y.o., female    PCP: Kandyce Rud, MD   Reason for Endocrinology Evaluation: Type 2 Diabetes Mellitus     Date of Initial Endocrinology Visit: 10/03/2021     PATIENT IDENTIFIER: Denise Villa is a 61 y.o. female with a past medical history of HTN and T2DM . The patient presented for initial endocrinology clinic visit on 10/03/2021 for consultative assistance with her diabetes management.    HPI: Ms. Fitzsimons was accompanied by her spouse today   Diagnosed with DM years ago  Prior Medications tried/Intolerance: was on insulin but stopped due to weight gain ( 2022)  Currently checking blood sugars 3 x / day Hypoglycemia episodes : no                Hemoglobin A1c has ranged from 6.9 % in 2021, peaking at 11.9% in 2021. Patient required assistance for hypoglycemia: no  Patient has required hospitalization within the last 1 year from hyper or hypoglycemia: no  In terms of diet, the patient eats 1-2 meals a day, doesn't;t snack much. Avoids sugar-sweetened beverages   Was seen by podiatry on 4/70/2023  Unable to exercise due to knee pain  NO hx of pancreatitis    HOME DIABETES REGIMEN: GlipiZide 10 mg XL daily  Metformin 1000 mg XR  BID    Statin: no ACE-I/ARB: yes   GLUCOSE LOG:       DIABETIC COMPLICATIONS: Microvascular complications:  Retinopathy ( S/P laser treastment B/l)  Denies: CKD, retinopathy  Last eye exam: Completed 08/2021  Macrovascular complications:   Denies: CAD, PVD, CVA   PAST HISTORY: Past Medical History:  Past Medical History:  Diagnosis Date   Arthritis    Diabetes mellitus without complication (HCC)    Hypertension    Past Surgical History:  Past Surgical History:  Procedure Laterality Date   ABDOMINAL HYSTERECTOMY     COLONOSCOPY WITH PROPOFOL N/A 01/26/2017   Procedure: COLONOSCOPY WITH PROPOFOL;  Surgeon: Scot Jun, MD;  Location:  [facility] ENDOSCOPY;  Service: Endoscopy;  Laterality: N/A;    Social History:  reports that she has never smoked. She has never used smokeless tobacco. She reports that she does not drink alcohol and does not use drugs. Family History:  Family History  Problem Relation Age of Onset   Diabetes Mother    Breast cancer Neg Hx      HOME MEDICATIONS: Allergies as of 10/03/2021       Reactions   Penicillin G Rash   Procaine Rash        Medication List        Accurate as of October 03, 2021  2:33 PM. If you have any questions, ask your nurse or doctor.          atenolol 100 MG tablet Commonly known as: TENORMIN Take 100 mg by mouth daily.   BLACK CURRANT SEED OIL PO Take 0.5 packets by mouth 2 (two) times daily.   Cinnamon 500 MG capsule Take 500 mg by mouth 3 (three) times daily.   glipiZIDE 10 MG 24 hr tablet Commonly known as: GLUCOTROL XL Take 10 mg by mouth daily with breakfast.   hydrochlorothiazide 25 MG tablet Commonly known as: HYDRODIURIL Take 25 mg by mouth daily.   lisinopril 40 MG tablet Commonly known as: ZESTRIL Take 40 mg by mouth daily.   meloxicam 15 MG tablet Commonly known as: MOBIC Take 15 mg  by mouth daily.   metFORMIN 500 MG 24 hr tablet Commonly known as: GLUCOPHAGE-XR Take 1,000 mg by mouth 2 (two) times daily. What changed: Another medication with the same name was removed. Continue taking this medication, and follow the directions you see here. Changed by: Scarlette Shorts, MD   Multi-Vitamins Tabs Take 1 tablet by mouth daily.   polyethylene glycol powder 17 GM/SCOOP powder Commonly known as: GLYCOLAX/MIRALAX Take as directed for colonoscopy prep.         ALLERGIES: Allergies  Allergen Reactions   Penicillin G Rash   Procaine Rash     REVIEW OF SYSTEMS: A comprehensive ROS was conducted with the patient and is negative except as per HPI     OBJECTIVE:   VITAL SIGNS: BP 140/80 (BP Location: Left Arm, Patient  Position: Sitting, Cuff Size: Large)   Pulse 69   Ht 5\' 7"  (1.702 m)   Wt 231 lb (104.8 kg)   SpO2 99%   BMI 36.18 kg/m    PHYSICAL EXAM:  General: Pt appears well and is in NAD  Neck: General: Supple without adenopathy or carotid bruits. Thyroid: Thyroid size normal.  No goiter or nodules appreciated.   Lungs: Clear with good BS bilat with no rales, rhonchi, or wheezes  Heart: RRR with normal S1 and S2 and no gallops; no murmurs; no rub  Abdomen: Normoactive bowel sounds, soft, nontender, without masses or organomegaly palpable  Extremities:  Lower extremities - No pretibial edema. No lesions.  Neuro: MS is good with appropriate affect, pt is alert and Ox3    DM foot exam: 10/03/2021  The skin of the feet is intact without sores or ulcerations. The pedal pulses are 2+ on right and 2+ on left. The sensation is intact to a screening 5.07, 10 gram monofilament bilaterally   DATA REVIEWED:  09/30/2021 Glucose 218 Sodium 138 Potassium 3.8 BUN 15 Creatinine 0.7 GFR 102 LDL 100 HDL 42.8 TG 114 ASSESSMENT / PLAN / RECOMMENDATIONS:   1) Type 2 Diabetes Mellitus, poorly controlled, With retinopathic   complications - Most recent A1c of 8.7 %. Goal A1c < 7.0 %.    Plan: GENERAL: I have discussed with the patient the pathophysiology of diabetes. We went over the natural progression of the disease. We talked about both insulin resistance and insulin deficiency. We stressed the importance of lifestyle changes including diet and exercise. I explained the complications associated with diabetes including retinopathy, nephropathy, neuropathy as well as increased risk of cardiovascular disease. We went over the benefit seen with glycemic control.   I explained to the patient that diabetic patients are at higher than normal risk for amputations.  We discussed add-on therapy with GLP 1 agonist versus SGLT2 inhibitors She used to be on insulin but stopped due to extreme weight gain which  was distressing Today she was provided with a 30-day sample of Rybelsus 3 mg, cautioned against GI side effects I will switch glipizide from XL to plain and she will take that before breakfast and supper  MEDICATIONS: Stop glipizide 10 mg XL Start glipizide 5 mg twice daily Start Rybelsus 3 mg daily for 30 days, then increase to 7 mg daily Continue metformin at 1000 mg twice daily  EDUCATION / INSTRUCTIONS: BG monitoring instructions: Patient is instructed to check her blood sugars 2 times a day, before breakfast and supper. Call [facility] if: BG persistently < 70  I reviewed the Rule of 15 for the treatment of hypoglycemia in detail  with the patient. Literature supplied.   2) Diabetic complications:  Eye: Does  have known diabetic retinopathy.  Neuro/ Feet: Does not have known diabetic peripheral neuropathy. Renal: Patient does not have known baseline CKD. She is  on an ACEI/ARB at present.  3) Dyslipidemia :   - She has been offered statin therpay through her PCP but declined due to risk of elevated LFT - Discussed cardiovascular benefits of statin therapy  -We discussed the importance of weighing the risk versus the benefit and at this time I do think that the benefit of starting statin therapy outweighs the risk - Will revisit on next visit    Follow-up in 3 months   Signed electronically by: Lyndle Herrlich, MD  [facility] End[facility]  [facility] [address] [place] [place], [place] [address] Phone: [phone] FAX: [phone]   CC: Kandyce Rud, MD [address] [facility] [place] [address] Phone: [phone]  Fax: [phone]    Return to Endocrinology clinic as below: No future appointments.

## 2021-10-03 NOTE — Patient Instructions (Addendum)
-   Stop Glipizide 10 mg XL  - Start Glipizide 5 mg, 1 tablet before Breakfast and 1 tablet before supper  - Start Rybelsus 3 mg, 1 tablet before Breakfast  for a month, than increase to 7 mg daily  - Continue Metformin 1000 mg twice daily      HOW TO TREAT LOW BLOOD SUGARS (Blood sugar LESS THAN 70 MG/DL) Please follow the RULE OF 15 for the treatment of hypoglycemia treatment (when your (blood sugars are less than 70 mg/dL)   STEP 1: Take 15 grams of carbohydrates when your blood sugar is low, which includes:  3-4 GLUCOSE TABS  OR 3-4 OZ OF JUICE OR REGULAR SODA OR ONE TUBE OF GLUCOSE GEL    STEP 2: RECHECK blood sugar in 15 MINUTES STEP 3: If your blood sugar is still low at the 15 minute recheck --> then, go back to STEP 1 and treat AGAIN with another 15 grams of carbohydrates.

## 2022-01-31 NOTE — Progress Notes (Unsigned)
Name: Denise Villa  MRN/ DOB: [id number], 06/15/1960   Age/ Sex: 61 y.o., female    PCP: Denise Rud, MD   Reason for Endocrinology Evaluation: Type 2 Diabetes Mellitus     Date of Initial Endocrinology Visit: 10/03/2021    PATIENT IDENTIFIER: Denise Villa is a 61 y.o. female with a past medical history of HTN and T2DM . The patient presented for initial endocrinology clinic visit on 10/03/2021 for consultative assistance with her diabetes management.    HPI: [name] was accompanied by her spouse today   Diagnosed with DM years ago  Prior Medications tried/Intolerance: was on insulin but stopped due to weight gain ( 2022)         Hemoglobin A1c has ranged from 6.9 % in 2021, peaking at 11.9% in 2021.  On her initial visit to our clinic she had an A1c of 8.7%, she was on glipizide XL which was switched to regular glipizide, continued metformin, and started her on Rybelsus SUBJECTIVE:   During the last visit (10/03/2021): A1c 8.7%  Today (@TD @): Denise Villa is here for follow-up on diabetes management. She checks her blood sugars *** times daily. The patient has *** had hypoglycemic episodes since the last clinic visit.  Unable to exercise due to knee pains  She follows with podiatry, last visit 08/01/2021 At her physical with her PCP on 12/23/2021    HOME DIABETES REGIMEN: GlipiZide 5 mg twice daily Metformin 1000 mg XR  BID  Rybelsus 7 mg daily   Statin: no ACE-I/ARB: yes   GLUCOSE LOG:       DIABETIC COMPLICATIONS: Microvascular complications:  Retinopathy ( S/P laser treastment B/l)  Denies: CKD, retinopathy  Last eye exam: Completed 08/2021  Macrovascular complications:   Denies: CAD, PVD, CVA   PAST HISTORY: Past Medical History:  Past Medical History:  Diagnosis Date   Arthritis    Diabetes mellitus without complication (HCC)    Hypertension    Past Surgical History:  Past Surgical History:  Procedure Laterality Date    ABDOMINAL HYSTERECTOMY     COLONOSCOPY WITH PROPOFOL N/A 01/26/2017   Procedure: COLONOSCOPY WITH PROPOFOL;  Surgeon: 03/28/2017, MD;  Location: [facility] ENDOSCOPY;  Service: Endoscopy;  Laterality: N/A;    Social History:  reports that she has never smoked. She has never used smokeless tobacco. She reports that she does not drink alcohol and does not use drugs. Family History:  Family History  Problem Relation Age of Onset   Diabetes Mother    Breast cancer Neg Hx      HOME MEDICATIONS: Allergies as of 02/01/2022       Reactions   Penicillin G Rash   Procaine Rash        Medication List        Accurate as of January 31, 2022  9:56 AM. If you have any questions, ask your nurse or doctor.          atenolol 100 MG tablet Commonly known as: TENORMIN Take 100 mg by mouth daily.   BLACK CURRANT SEED OIL PO Take 0.5 packets by mouth 2 (two) times daily.   Cinnamon 500 MG capsule Take 500 mg by mouth 3 (three) times daily.   glipiZIDE 5 MG tablet Commonly known as: GLUCOTROL Take 1 tablet (5 mg total) by mouth 2 (two) times daily before a meal.   hydrochlorothiazide 25 MG tablet Commonly known as: HYDRODIURIL Take 25 mg by mouth daily.   lisinopril 40  MG tablet Commonly known as: ZESTRIL Take 40 mg by mouth daily.   meloxicam 15 MG tablet Commonly known as: MOBIC Take 15 mg by mouth daily.   metFORMIN 1000 MG tablet Commonly known as: GLUCOPHAGE Take 1 tablet (1,000 mg total) by mouth 2 (two) times daily with a meal.   Multi-Vitamins Tabs Take 1 tablet by mouth daily.   polyethylene glycol powder 17 GM/SCOOP powder Commonly known as: GLYCOLAX/MIRALAX Take as directed for colonoscopy prep.   Rybelsus 7 MG Tabs Generic drug: Semaglutide Take 7 mg by mouth daily.         ALLERGIES: Allergies  Allergen Reactions   Penicillin G Rash   Procaine Rash     REVIEW OF SYSTEMS: A comprehensive ROS was conducted with the patient and is  negative except as per HPI     OBJECTIVE:   VITAL SIGNS: There were no vitals taken for this visit.   PHYSICAL EXAM:  General: Pt appears well and is in NAD  Neck: General: Supple without adenopathy or carotid bruits. Thyroid: Thyroid size normal.  No goiter or nodules appreciated.   Lungs: Clear with good BS bilat with no rales, rhonchi, or wheezes  Heart: RRR with normal S1 and S2 and no gallops; no murmurs; no rub  Abdomen: Normoactive bowel sounds, soft, nontender, without masses or organomegaly palpable  Extremities:  Lower extremities - No pretibial edema. No lesions.  Neuro: MS is good with appropriate affect, pt is alert and Ox3    DM foot exam: 10/03/2021  The skin of the feet is intact without sores or ulcerations. The pedal pulses are 2+ on right and 2+ on left. The sensation is intact to a screening 5.07, 10 gram monofilament bilaterally   DATA REVIEWED:  09/30/2021 Glucose 218 Sodium 138 Potassium 3.8 BUN 15 Creatinine 0.7 GFR 102 LDL 100 HDL 42.8 TG 114 ASSESSMENT / PLAN / RECOMMENDATIONS:   1) Type 2 Diabetes Mellitus, poorly controlled, With retinopathic   complications - Most recent A1c of 8.7 %. Goal A1c < 7.0 %.    Plan: GENERAL: I have discussed with the patient the pathophysiology of diabetes. We went over the natural progression of the disease. We talked about both insulin resistance and insulin deficiency. We stressed the importance of lifestyle changes including diet and exercise. I explained the complications associated with diabetes including retinopathy, nephropathy, neuropathy as well as increased risk of cardiovascular disease. We went over the benefit seen with glycemic control.   I explained to the patient that diabetic patients are at higher than normal risk for amputations.  We discussed add-on therapy with GLP 1 agonist versus SGLT2 inhibitors She used to be on insulin but stopped due to extreme weight gain which was distressing Today  she was provided with a 30-day sample of Rybelsus 3 mg, cautioned against GI side effects I will switch glipizide from XL to plain and she will take that before breakfast and supper  MEDICATIONS: Stop glipizide 10 mg XL Start glipizide 5 mg twice daily Start Rybelsus 3 mg daily for 30 days, then increase to 7 mg daily Continue metformin at 1000 mg twice daily  EDUCATION / INSTRUCTIONS: BG monitoring instructions: Patient is instructed to check her blood sugars 2 times a day, before breakfast and supper. Call [facility] if: BG persistently < 70  I reviewed the Rule of 15 for the treatment of hypoglycemia in detail with the patient. Literature supplied.   2) Diabetic complications:  Eye: Does  have  known diabetic retinopathy.  Neuro/ Feet: Does not have known diabetic peripheral neuropathy. Renal: Patient does not have known baseline CKD. She is  on an ACEI/ARB at present.  3) Dyslipidemia :   - She has been offered statin therpay through her PCP but declined due to risk of elevated LFT - Discussed cardiovascular benefits of statin therapy  -We discussed the importance of weighing the risk versus the benefit and at this time I do think that the benefit of starting statin therapy outweighs the risk - Will revisit on next visit    Follow-up in 3 months   Signed electronically by: Mack Guise, MD  [facility] Endocrinology  [facility] [address] [place], [place] [address] Phone: [phone] FAX: [phone]   CC: Derinda Late, MD [address] [facility] Medicine [place] [address] Phone: [phone]  Fax: [phone]    Return to Endocrinology clinic as below: Future Appointments  Date Time Provider [place]  02/01/2022 10:10 AM Jaymond Waage, Melanie Crazier, MD [id number] None

## 2022-02-01 ENCOUNTER — Telehealth: Payer: BC Managed Care – PPO | Admitting: Internal Medicine

## 2022-02-01 ENCOUNTER — Encounter: Payer: Self-pay | Admitting: Internal Medicine

## 2022-02-01 VITALS — BP 132/80 | HR 84 | Ht 67.0 in | Wt 230.0 lb

## 2022-02-01 DIAGNOSIS — E1165 Type 2 diabetes mellitus with hyperglycemia: Secondary | ICD-10-CM

## 2022-02-01 DIAGNOSIS — E113593 Type 2 diabetes mellitus with proliferative diabetic retinopathy without macular edema, bilateral: Secondary | ICD-10-CM

## 2022-02-01 DIAGNOSIS — E785 Hyperlipidemia, unspecified: Secondary | ICD-10-CM | POA: Diagnosis not present

## 2022-02-01 LAB — POCT GLYCOSYLATED HEMOGLOBIN (HGB A1C): Hemoglobin A1C: 7.1 % — AB (ref 4.0–5.6)

## 2022-02-01 NOTE — Patient Instructions (Addendum)
-   Keep Up the Good Work ! - Continue Glipizide 5 mg, 1 tablet before Breakfast and 1 tablet before supper  - Continue  Rybelsus 7 mg, 1 tablet daily  - Continue Metformin 1000 mg twice daily      HOW TO TREAT LOW BLOOD SUGARS (Blood sugar LESS THAN 70 MG/DL) Please follow the RULE OF 15 for the treatment of hypoglycemia treatment (when your (blood sugars are less than 70 mg/dL)   STEP 1: Take 15 grams of carbohydrates when your blood sugar is low, which includes:  3-4 GLUCOSE TABS  OR 3-4 OZ OF JUICE OR REGULAR SODA OR ONE TUBE OF GLUCOSE GEL    STEP 2: RECHECK blood sugar in 15 MINUTES STEP 3: If your blood sugar is still low at the 15 minute recheck --> then, go back to STEP 1 and treat AGAIN with another 15 grams of carbohydrates.

## 2022-04-26 ENCOUNTER — Other Ambulatory Visit: Payer: Self-pay

## 2022-04-26 DIAGNOSIS — E1165 Type 2 diabetes mellitus with hyperglycemia: Secondary | ICD-10-CM

## 2022-04-26 MED ORDER — RYBELSUS 7 MG PO TABS: 7.0000 mg | ORAL_TABLET | Freq: Every day | ORAL | 1 refills | Status: DC

## 2022-04-28 ENCOUNTER — Other Ambulatory Visit (HOSPITAL_COMMUNITY): Payer: Self-pay

## 2022-05-17 ENCOUNTER — Other Ambulatory Visit: Payer: Self-pay | Admitting: Family Medicine

## 2022-05-17 DIAGNOSIS — Z1231 Encounter for screening mammogram for malignant neoplasm of breast: Secondary | ICD-10-CM

## 2022-07-05 ENCOUNTER — Ambulatory Visit
Admission: RE | Admit: 2022-07-05 | Discharge: 2022-07-05 | Disposition: A | Discharge status: Home / Self Care | Payer: BC Managed Care – PPO | Source: Ambulatory Visit | Attending: Family Medicine | Admitting: Family Medicine

## 2022-07-05 DIAGNOSIS — Z1231 Encounter for screening mammogram for malignant neoplasm of breast: Secondary | ICD-10-CM

## 2022-07-17 IMAGING — MG MM DIGITAL SCREENING BILAT W/ TOMO AND CAD
6 of 10 series · 6 of 30 positions shown · non-contrast
Comparison: Previous exam(s).

CLINICAL DATA: Screening.

EXAM:
DIGITAL SCREENING BILATERAL MAMMOGRAM WITH TOMOSYNTHESIS AND CAD
TECHNIQUE: Bilateral screening digital craniocaudal and mediolateral oblique
mammograms were obtained. Bilateral screening digital breast
tomosynthesis was performed. The images were evaluated with
computer-aided detection.

[L MLO synth-2D (1 of 2)]
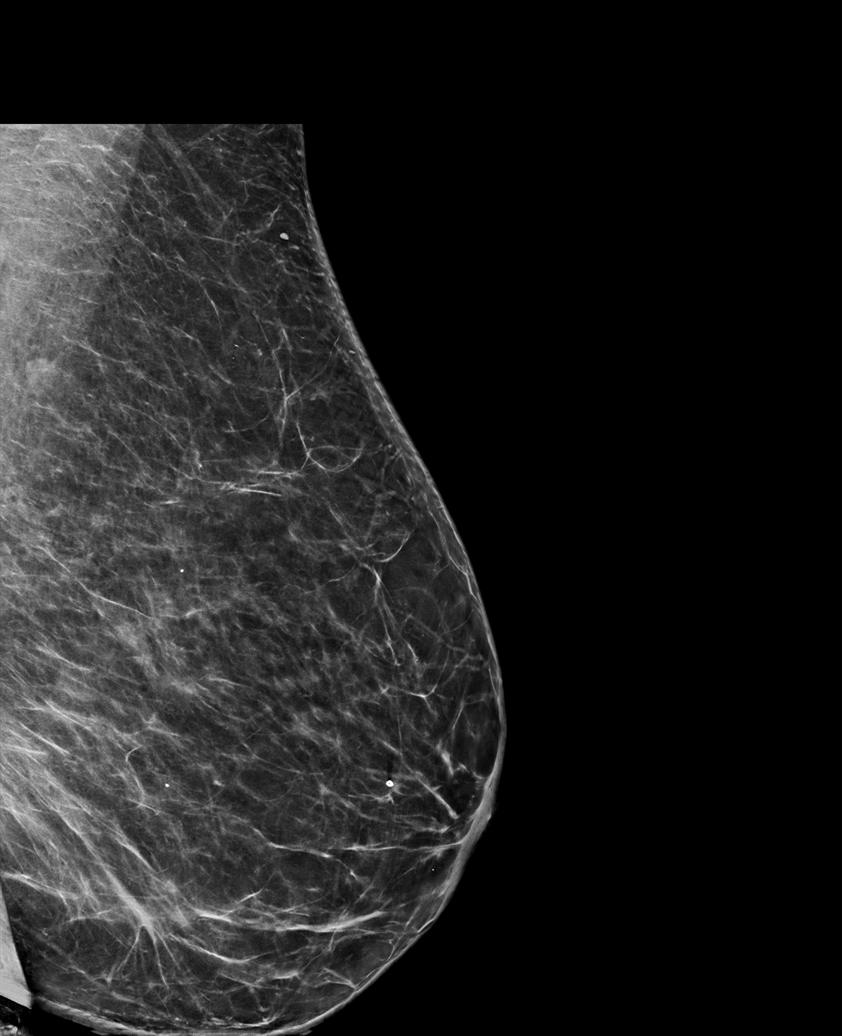

[R MLO synth-2D]
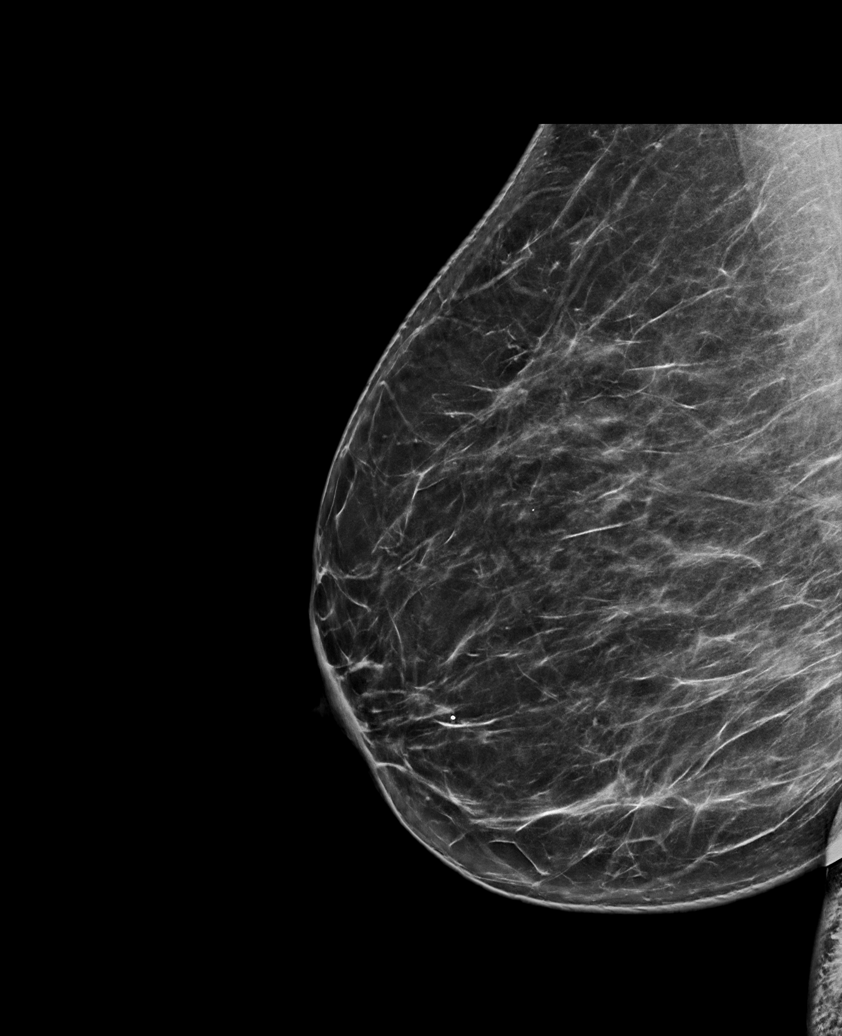

[L CC synth-2D]
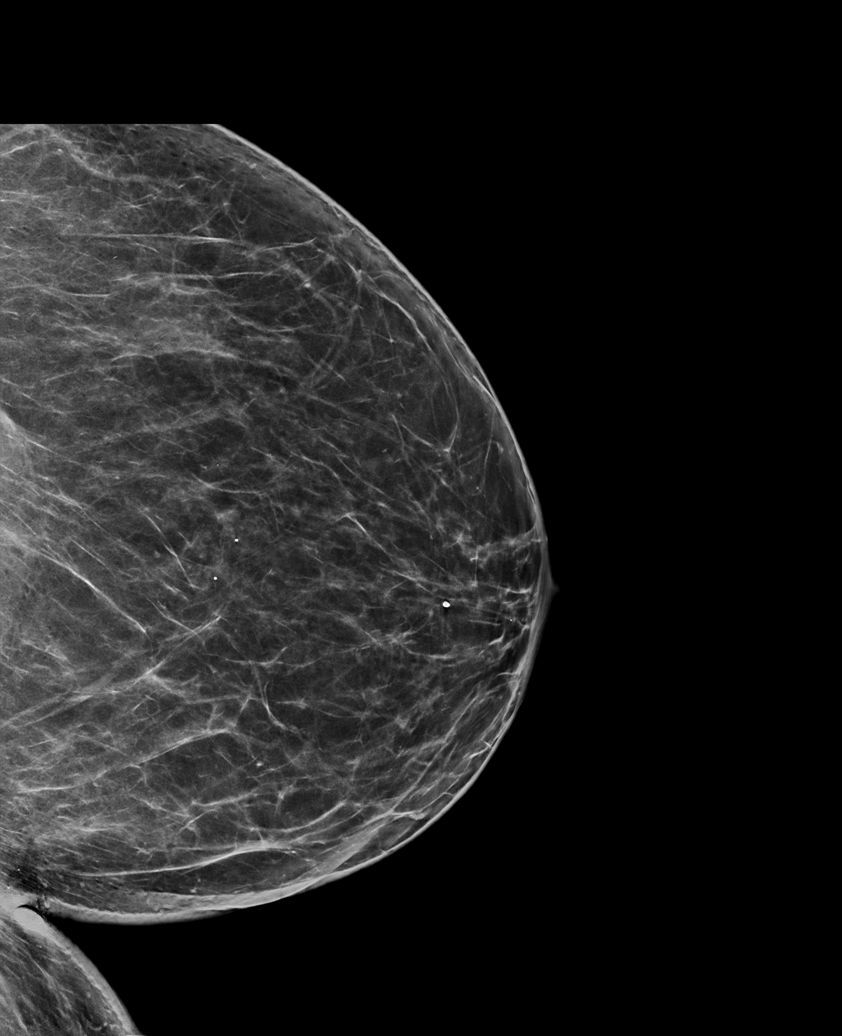

[R CC synth-2D]
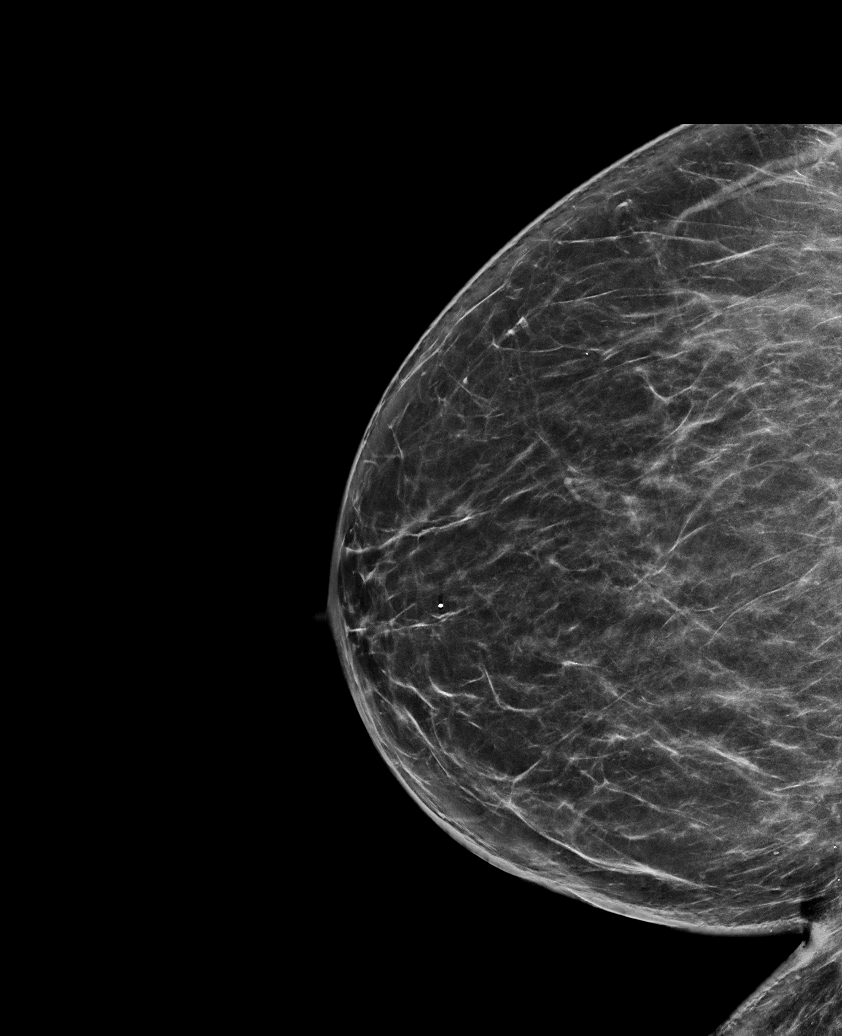

[L MLO synth-2D (2 of 2)]
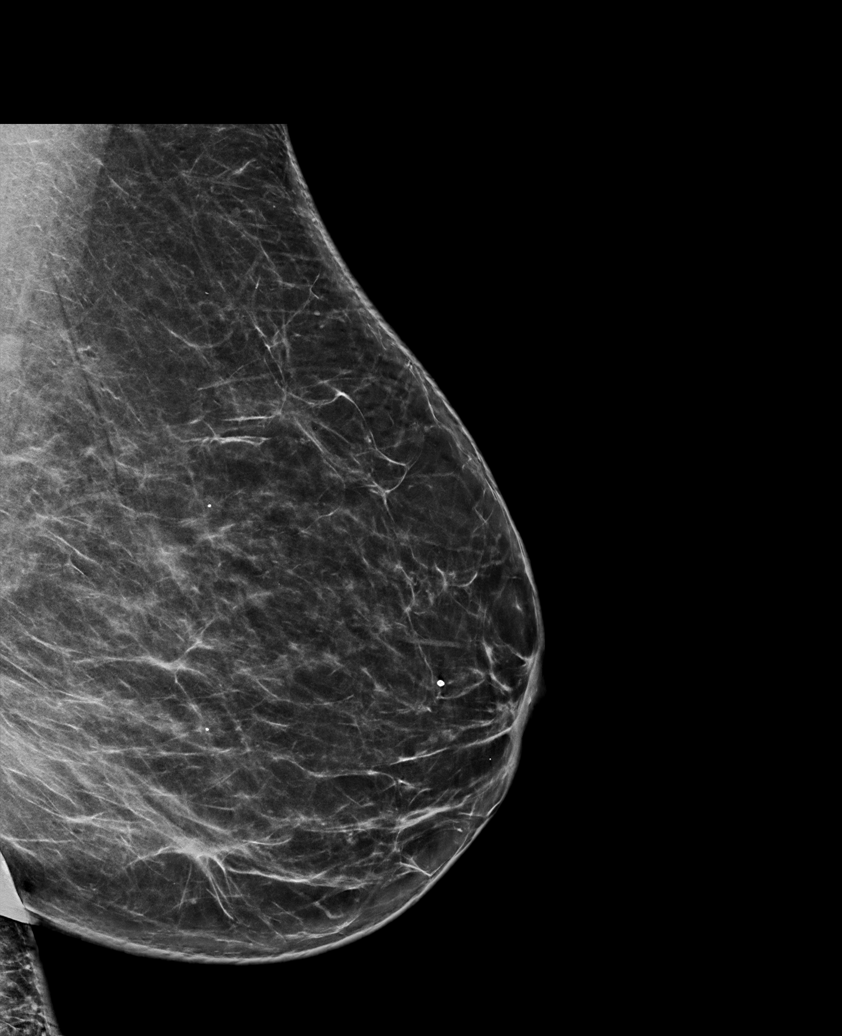

[L MLO tomo · tomo slice 41/80.0]
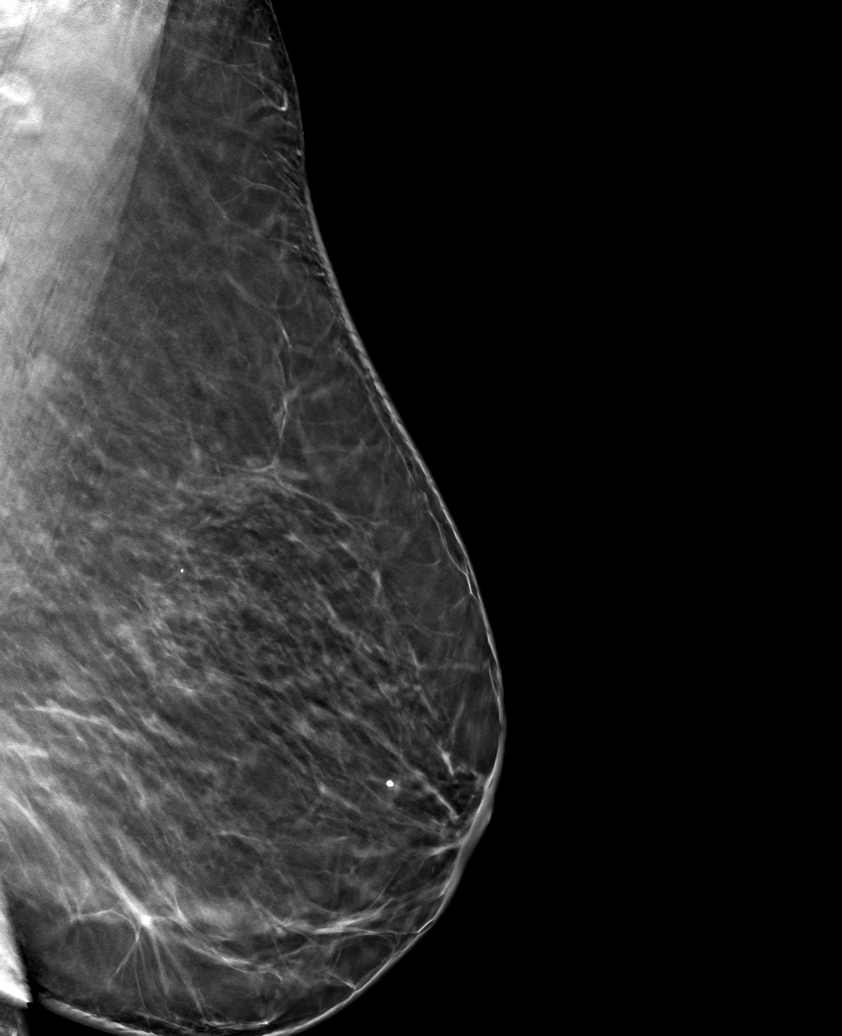

[6 of 30 positions shown; findings below may reference images not displayed]

ACR Breast Density Category b: There are scattered areas of
fibroglandular density.
FINDINGS: There are no findings suspicious for malignancy.
IMPRESSION: No mammographic evidence of malignancy. A result letter of this
screening mammogram will be mailed directly to the patient.

RECOMMENDATION:
Screening mammogram in one year. (Code:51-O-LD2)

BI-RADS CATEGORY  1: Negative.

## 2022-08-03 ENCOUNTER — Encounter: Payer: Self-pay | Admitting: Internal Medicine

## 2022-08-03 ENCOUNTER — Ambulatory Visit: Payer: BC Managed Care – PPO | Admitting: Internal Medicine

## 2022-08-03 VITALS — BP 136/84 | HR 88 | Ht 67.0 in | Wt 231.0 lb

## 2022-08-03 DIAGNOSIS — E113593 Type 2 diabetes mellitus with proliferative diabetic retinopathy without macular edema, bilateral: Secondary | ICD-10-CM

## 2022-08-03 DIAGNOSIS — E1165 Type 2 diabetes mellitus with hyperglycemia: Secondary | ICD-10-CM | POA: Diagnosis not present

## 2022-08-03 DIAGNOSIS — E785 Hyperlipidemia, unspecified: Secondary | ICD-10-CM

## 2022-08-03 LAB — POCT GLYCOSYLATED HEMOGLOBIN (HGB A1C): Hemoglobin A1C: 7.9 % — AB (ref 4.0–5.6)

## 2022-08-03 MED ORDER — METFORMIN HCL 1000 MG PO TABS: 1000.0000 mg | ORAL_TABLET | Freq: Two times a day (BID) | ORAL | 3 refills | Status: AC

## 2022-08-03 MED ORDER — GLIPIZIDE 5 MG PO TABS: ORAL_TABLET | ORAL | 3 refills | Status: AC

## 2022-08-03 MED ORDER — RYBELSUS 7 MG PO TABS: 7.0000 mg | ORAL_TABLET | Freq: Every day | ORAL | 3 refills | Status: DC

## 2022-08-03 NOTE — Progress Notes (Signed)
Name: Denise Villa  MRN/ DOB: [id number], 02/15/1961   Age/ Sex: 62 y.o., female    PCP: Kandyce Rud, MD   Reason for Endocrinology Evaluation: Type 2 Diabetes Mellitus     Date of Initial Endocrinology Visit: 10/03/2021    PATIENT IDENTIFIER: [name] is a 62 y.o. female with a past medical history of HTN and T2DM . The patient presented for initial endocrinology clinic visit on 10/03/2021 for consultative assistance with her diabetes management.    HPI:  Diagnosed with DM years ago  Prior Medications tried/Intolerance: was on insulin but stopped due to weight gain ( 2022)         Hemoglobin A1c has ranged from 6.9 % in 2021, peaking at 11.9% in 2021.  On her initial visit to our clinic she had an A1c of 8.7%, she was on glipizide XL which was switched to regular glipizide, continued metformin, and started her on Rybelsus SUBJECTIVE:   During the last visit (02/01/2022): this was an virtual visit     Today (08/03/22): Denise Villa is here for follow-up on diabetes management. She checks her blood sugars 1-2 times daily. The patient has not  had hypoglycemic episodes since the last clinic visit, but endorses symptoms of hypoglycemia in the afternoon especially with exercise     She has been sometimes missing the 2nd dose of glipizide while taking care of her mother  Denies diarrhea, nausea or vomiting   She is having eye treatments and is concerned about Rybelsus , s/p injections    HOME DIABETES REGIMEN: GlipiZide 5 mg twice daily Metformin 1000 mg XR  BID  Rybelsus 7 mg daily    Statin: no ACE-I/ARB: yes   METER DOWNLOAD SUMMARY: 4/5-4/18/2024 Fingerstick Blood Glucose Tests = 8 Overall Mean FS Glucose = 144 Standard Deviation = 29  BG Ranges: Low = 112 High = 197    Hypoglycemic Events/30 Days: BG < 50 = 0 Episodes of symptomatic severe hypoglycemia = 0   DIABETIC COMPLICATIONS: Microvascular complications:  Retinopathy (  S/P laser treastment B/l)  Denies: CKD, retinopathy  Last eye exam: Completed 08/2021  Macrovascular complications:   Denies: CAD, PVD, CVA   PAST HISTORY: Past Medical History:  Past Medical History:  Diagnosis Date   Arthritis    Diabetes mellitus without complication    Hypertension    Past Surgical History:  Past Surgical History:  Procedure Laterality Date   ABDOMINAL HYSTERECTOMY     COLONOSCOPY WITH PROPOFOL N/A 01/26/2017   Procedure: COLONOSCOPY WITH PROPOFOL;  Surgeon: Scot Jun, MD;  Location: [facility] END[facility];  Service: Endoscopy;  Laterality: N/A;    Social History:  reports that she has never smoked. She has never used smokeless tobacco. She reports that she does not drink alcohol and does not use drugs. Family History:  Family History  Problem Relation Age of Onset   Diabetes Mother    Breast cancer Neg Hx      HOME MEDICATIONS: Allergies as of 08/03/2022       Reactions   Penicillin G Rash   Procaine Rash        Medication List        Accurate as of August 03, 2022 11:09 AM. If you have any questions, ask your nurse or doctor.          atenolol 100 MG tablet Commonly known as: TENORMIN Take 100 mg by mouth daily.   BLACK CURRANT SEED OIL PO Take 0.5 packets  by mouth 2 (two) times daily.   Cinnamon 500 MG capsule Take 500 mg by mouth 3 (three) times daily.   glipiZIDE 5 MG tablet Commonly known as: GLUCOTROL Take 1 tablet (5 mg total) by mouth 2 (two) times daily before a meal.   hydrochlorothiazide 25 MG tablet Commonly known as: HYDRODIURIL Take 25 mg by mouth daily.   lisinopril 40 MG tablet Commonly known as: ZESTRIL Take 40 mg by mouth daily.   meloxicam 15 MG tablet Commonly known as: MOBIC Take 15 mg by mouth daily.   metFORMIN 1000 MG tablet Commonly known as: GLUCOPHAGE Take 1 tablet (1,000 mg total) by mouth 2 (two) times daily with a meal.   Multi-Vitamins Tabs Take 1 tablet by mouth daily.    polyethylene glycol powder 17 GM/SCOOP powder Commonly known as: GLYCOLAX/MIRALAX Take as directed for colonoscopy prep.   Rybelsus 7 MG Tabs Generic drug: Semaglutide Take 7 mg by mouth daily.         ALLERGIES: Allergies  Allergen Reactions   Penicillin G Rash   Procaine Rash     REVIEW OF SYSTEMS: A comprehensive ROS was conducted with the patient and is negative except as per HPI     OBJECTIVE:   VITAL SIGNS: BP 136/84 (BP Location: Left Arm, Patient Position: Sitting, Cuff Size: Large)   Pulse 88   Ht 5\' 7"  (1.702 m)   Wt 231 lb (104.8 kg)   SpO2 99%   BMI 36.18 kg/m    PHYSICAL EXAM:  General: Pt appears well and is in NAD  Lungs: Clear with good BS bilat  Heart: RRR   Extremities:  Lower extremities - No pretibial edema.   Neuro: MS is good with appropriate affect, pt is alert and Ox3    DM foot exam: 10/03/2021  The skin of the feet is intact without sores or ulcerations. The pedal pulses are 2+ on right and 2+ on left. The sensation is intact to a screening 5.07, 10 gram monofilament bilaterally     DATA REVIEWED: 06/16/2022  Glucose 210 Sodium 140 Potassium 4.1 BUN 14 Creatinine 0.6 GFR 100 LDL 93 HDL 40.7 TG 109 TSH 1.659  ASSESSMENT / PLAN / RECOMMENDATIONS:   1) Type 2 Diabetes Mellitus, poorly controlled, With retinopathic   complications - Most recent A1c of 7.9 %. Goal A1c < 7.0 %.    - A1c has trendedup again  - She declined increasing Rybelsus due to DR , I explained to the pt this risk has been associated with Ozempic  but not rybelsus based on the literature - We opted to increase Glipizide in the morning as below    MEDICATIONS:  Increase Glipizide 5 mg, 1.5 tabs before breakfast and 1 tab before Supper  Continue Rybelsus 7 mg daily Continue metformin at 1000 mg twice daily  EDUCATION / INSTRUCTIONS: BG monitoring instructions: Patient is instructed to check her blood sugars 2 times a day, before breakfast and  supper. Call [facility] if: BG persistently < 70  I reviewed the Rule of 15 for the treatment of hypoglycemia in detail with the patient. Literature supplied.   2) Diabetic complications:  Eye: Does  have known diabetic retinopathy.  Neuro/ Feet: Does not have known diabetic peripheral neuropathy. Renal: Patient does not have known baseline CKD. She is  on an ACEI/ARB at present.     3) Dyslipidemia :   - She has been offered statin therpay through her PCP but declined due to risk  of elevated LFT   Follow-up in 6 months   Signed electronically by: Lyndle Herrlich, MD  [facility] Endocrinology  [facility] [address] [place], [place] [address] Phone: [phone] FAX: [phone]   CC: Kandyce Rud, MD [address] [facility] and [facility] [place] [address] Phone: [phone]  Fax: [phone]    Return to Endocrinology clinic as below: No future appointments.

## 2022-08-03 NOTE — Patient Instructions (Signed)
-   Increase Glipizide 5 mg, 1.5 tablet before Breakfast and 1 tablet before supper  - Continue  Rybelsus 7 mg, 1 tablet daily  - Continue Metformin 1000 mg twice daily      HOW TO TREAT LOW BLOOD SUGARS (Blood sugar LESS THAN 70 MG/DL) Please follow the RULE OF 15 for the treatment of hypoglycemia treatment (when your (blood sugars are less than 70 mg/dL)   STEP 1: Take 15 grams of carbohydrates when your blood sugar is low, which includes:  3-4 GLUCOSE TABS  OR 3-4 OZ OF JUICE OR REGULAR SODA OR ONE TUBE OF GLUCOSE GEL    STEP 2: RECHECK blood sugar in 15 MINUTES STEP 3: If your blood sugar is still low at the 15 minute recheck --> then, go back to STEP 1 and treat AGAIN with another 15 grams of carbohydrates.

## 2022-09-21 ENCOUNTER — Telehealth: Payer: Self-pay

## 2022-09-21 MED ORDER — RYBELSUS 14 MG PO TABS: 14.0000 mg | ORAL_TABLET | Freq: Every day | ORAL | 3 refills | Status: AC

## 2022-09-21 NOTE — Telephone Encounter (Signed)
Patient states that her blood sugars have been in the 200 range for the last week fasting. She would like to increased to the 14mg  of Rybelsus as advise at last office visit. Patient can wait until provider comes in this afternoon to respond.

## 2022-12-14 ENCOUNTER — Other Ambulatory Visit (HOSPITAL_COMMUNITY): Payer: Self-pay

## 2022-12-15 ENCOUNTER — Telehealth: Payer: Self-pay

## 2022-12-15 ENCOUNTER — Other Ambulatory Visit (HOSPITAL_COMMUNITY): Payer: Self-pay

## 2022-12-15 NOTE — Telephone Encounter (Signed)
Pharmacy Patient Advocate Encounter   Received notification from CoverMyMeds that prior authorization for Rybelsus is required/requested.   Insurance verification completed.   The patient is insured through [employer] [facility] .   Per test claim: PA required; PA submitted to [employer] [facility] via CoverMyMeds Key/confirmation #/[id number] [facility] Status is pending

## 2022-12-18 ENCOUNTER — Other Ambulatory Visit (HOSPITAL_COMMUNITY): Payer: Self-pay

## 2022-12-19 NOTE — Telephone Encounter (Signed)
Patient aware.

## 2022-12-19 NOTE — Telephone Encounter (Signed)
Pharmacy Patient Advocate Encounter  Received notification from [employer] [facility] that Prior Authorization for Rybelsus has been APPROVED through 12/14/2025   PA #/Case ID/Reference #: [id number]

## 2023-02-02 ENCOUNTER — Ambulatory Visit: Payer: BC Managed Care – PPO | Admitting: Internal Medicine

## 2023-06-08 ENCOUNTER — Other Ambulatory Visit: Payer: Self-pay | Admitting: Family Medicine

## 2023-06-08 DIAGNOSIS — Z1231 Encounter for screening mammogram for malignant neoplasm of breast: Secondary | ICD-10-CM

## 2023-07-06 ENCOUNTER — Ambulatory Visit
Admission: RE | Admit: 2023-07-06 | Discharge: 2023-07-06 | Disposition: A | Discharge status: Home / Self Care | Payer: Self-pay | Source: Ambulatory Visit | Attending: Family Medicine | Admitting: Family Medicine

## 2023-07-06 DIAGNOSIS — Z1231 Encounter for screening mammogram for malignant neoplasm of breast: Secondary | ICD-10-CM | POA: Diagnosis present
